# Patient Record
Sex: Male | Born: 1990 | Race: Asian | Hispanic: No | Marital: Single | Smoking: Never smoker
Health system: Southern US, Community
[De-identification: ages and names within clinical notes are randomized; demographics above are authoritative.]

## PROBLEM LIST (undated history)

## (undated) ENCOUNTER — Emergency Department: Admission: EM | Payer: Federal, State, Local not specified - PPO | Source: Home / Self Care

---

## 2010-11-11 ENCOUNTER — Emergency Department (HOSPITAL_COMMUNITY)
Admission: EM | Admit: 2010-11-11 | Discharge: 2010-11-11 | Disposition: A | Discharge status: Home / Self Care | Payer: Federal, State, Local not specified - PPO | Attending: Emergency Medicine | Admitting: Emergency Medicine

## 2010-11-11 DIAGNOSIS — L255 Unspecified contact dermatitis due to plants, except food: Secondary | ICD-10-CM | POA: Insufficient documentation

## 2010-11-11 DIAGNOSIS — T622X1A Toxic effect of other ingested (parts of) plant(s), accidental (unintentional), initial encounter: Secondary | ICD-10-CM | POA: Insufficient documentation

## 2011-11-04 ENCOUNTER — Ambulatory Visit (INDEPENDENT_AMBULATORY_CARE_PROVIDER_SITE_OTHER): Payer: Federal, State, Local not specified - PPO | Admitting: Family Medicine

## 2011-11-04 VITALS — BP 102/65 | HR 67 | Temp 97.6°F | Resp 16 | Ht 67.5 in | Wt 104.0 lb

## 2011-11-04 DIAGNOSIS — L255 Unspecified contact dermatitis due to plants, except food: Secondary | ICD-10-CM

## 2011-11-04 DIAGNOSIS — L237 Allergic contact dermatitis due to plants, except food: Secondary | ICD-10-CM

## 2011-11-04 MED ORDER — TRIAMCINOLONE ACETONIDE 0.1 % EX CREA: TOPICAL_CREAM | Freq: Two times a day (BID) | CUTANEOUS | Status: AC

## 2011-11-04 NOTE — Progress Notes (Signed)
  Subjective:    Patient ID: Timothy Moreno, male    DOB: [birth year], 21 y.o.   MRN: [id number]  HPI 21 yo male with concern for poison ivy rash.  Working in garden yesterday.  Wearing short sleeves and short.  Woke up this morning with very itchy red rash on forearms and lower legs.  Had poison ivy several months ago and let it go for awhile and it got very bad.    Review of Systems    Negative except as per HPI  Objective:   Physical Exam  Constitutional: He appears well-developed.  Pulmonary/Chest: Effort normal.  Neurological: He is alert.  Skin:       Erythematous macular rash in linear groupings, occasional vesicle, on forearms and lower legs bilaterally.           Assessment & Plan:  Poison ivy dermatitis - relatively small, localized patches.  Try to treat topically with TAC.  If worsens or not starting to imrpove in 3-4 days, call and we can call out a prednisone taper.

## 2011-12-20 ENCOUNTER — Ambulatory Visit (INDEPENDENT_AMBULATORY_CARE_PROVIDER_SITE_OTHER): Payer: Federal, State, Local not specified - PPO | Admitting: Family Medicine

## 2011-12-20 VITALS — BP 96/61 | HR 63 | Temp 98.3°F | Resp 16 | Ht 66.5 in | Wt 104.0 lb

## 2011-12-20 DIAGNOSIS — K120 Recurrent oral aphthae: Secondary | ICD-10-CM

## 2011-12-20 NOTE — Progress Notes (Signed)
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

## 2011-12-23 ENCOUNTER — Telehealth: Payer: Self-pay

## 2011-12-23 NOTE — Telephone Encounter (Signed)
Yes, ok to refill 

## 2011-12-23 NOTE — Telephone Encounter (Signed)
Ok to refill 

## 2011-12-23 NOTE — Telephone Encounter (Signed)
PT SPILLED CHLORHEXIDINE ORAL RINSE RX AND WOULD LIKE ANOTHER RX CALLED IN FOR HIM   PLEASE [facility]   [facility] [address]  PT PHONE [phone]

## 2011-12-23 NOTE — Telephone Encounter (Signed)
Called [facility] and spoke w/pharmacist and asked him to RF the chlorhexidine rinse for pt as previously Rxd. Notified pt.

## 2013-04-21 ENCOUNTER — Ambulatory Visit (INDEPENDENT_AMBULATORY_CARE_PROVIDER_SITE_OTHER): Payer: Federal, State, Local not specified - PPO | Admitting: Family Medicine

## 2013-04-21 DIAGNOSIS — Z23 Encounter for immunization: Secondary | ICD-10-CM

## 2014-03-09 ENCOUNTER — Ambulatory Visit (INDEPENDENT_AMBULATORY_CARE_PROVIDER_SITE_OTHER): Payer: Federal, State, Local not specified - PPO | Admitting: Radiology

## 2014-03-09 DIAGNOSIS — Z23 Encounter for immunization: Secondary | ICD-10-CM

## 2014-09-22 ENCOUNTER — Ambulatory Visit (INDEPENDENT_AMBULATORY_CARE_PROVIDER_SITE_OTHER): Payer: Federal, State, Local not specified - PPO

## 2014-09-22 ENCOUNTER — Ambulatory Visit (INDEPENDENT_AMBULATORY_CARE_PROVIDER_SITE_OTHER): Payer: Federal, State, Local not specified - PPO | Admitting: Emergency Medicine

## 2014-09-22 VITALS — BP 100/66 | HR 75 | Temp 97.8°F | Resp 12 | Ht 67.5 in | Wt 108.0 lb

## 2014-09-22 DIAGNOSIS — R1030 Lower abdominal pain, unspecified: Secondary | ICD-10-CM

## 2014-09-22 DIAGNOSIS — K5900 Constipation, unspecified: Secondary | ICD-10-CM

## 2014-09-22 LAB — POCT URINALYSIS DIPSTICK
Bilirubin, UA: NEGATIVE
GLUCOSE UA: NEGATIVE
KETONES UA: NEGATIVE
LEUKOCYTES UA: NEGATIVE
NITRITE UA: NEGATIVE
Protein, UA: NEGATIVE
RBC UA: NEGATIVE
SPEC GRAV UA: 1.015
Urobilinogen, UA: 0.2
pH, UA: 6

## 2014-09-22 LAB — POCT CBC
GRANULOCYTE PERCENT: 68 % (ref 37–80)
HCT, POC: 46.8 % (ref 43.5–53.7)
Hemoglobin: 16.4 g/dL (ref 14.1–18.1)
LYMPH, POC: 2 (ref 0.6–3.4)
MCH: 29.6 pg (ref 27–31.2)
MCHC: 35 g/dL (ref 31.8–35.4)
MCV: 84.4 fL (ref 80–97)
MID (CBC): 0.7 (ref 0–0.9)
MPV: 7.9 fL (ref 0–99.8)
POC Granulocyte: 5.6 (ref 2–6.9)
POC LYMPH PERCENT: 23.7 %L (ref 10–50)
POC MID %: 8.3 %M (ref 0–12)
Platelet Count, POC: 250 10*3/uL (ref 142–424)
RBC: 5.54 M/uL (ref 4.69–6.13)
RDW, POC: 13.3 %
WBC: 8.3 10*3/uL (ref 4.6–10.2)

## 2014-09-22 LAB — POCT UA - MICROSCOPIC ONLY
Bacteria, U Microscopic: NEGATIVE
CASTS, UR, LPF, POC: NEGATIVE
CRYSTALS, UR, HPF, POC: NEGATIVE
Epithelial cells, urine per micros: NEGATIVE
Mucus, UA: NEGATIVE
RBC, urine, microscopic: NEGATIVE
WBC, Ur, HPF, POC: NEGATIVE
Yeast, UA: NEGATIVE

## 2014-09-22 MED ORDER — BISACODYL 5 MG PO TBEC: 5.0000 mg | DELAYED_RELEASE_TABLET | Freq: Every day | ORAL | Status: AC | PRN

## 2014-09-22 MED ORDER — POLYETHYLENE GLYCOL 3350 17 GM/SCOOP PO POWD: 17.0000 g | Freq: Two times a day (BID) | ORAL | Status: AC | PRN

## 2014-09-22 NOTE — Progress Notes (Signed)
[facility] [address], [place] [address] [phone]  Date:  09/22/2014   Name:  MASLOWien Eckert   DOB:  01-13-1991   MRN:  [id number]  PCP:  No PCP Per Patient    Chief Complaint: Abdominal Pain; Abdominal Cramping; and Constipation   History of Present Illness:  MOULTRIEien Seese is a 24 y.o. very pleasant male patient who presents with the following:  Patient states he is constipated. Last BM 0400 yesterday and was hard and scant No blood.   Took pepto yesterday without result. Had difficulty sleeping last night due abdominal cramping. Denies hypothyroid, change in diet or hydration.  No history of GI disease No nausea or vomiting. No improvement with over the counter medications or other home remedies.  Denies other complaint or health concern today.   There are no active problems to display for this patient.   No past medical history on file.  No past surgical history on file.  History  Substance Use Topics  . Smoking status: Never Smoker   . Smokeless tobacco: Never Used  . Alcohol Use: Not on file    No family history on file.  No Known Allergies  Medication list has been reviewed and updated.  Current Outpatient Prescriptions on File Prior to Visit  Medication Sig Dispense Refill  . Ibuprofen (ADVIL PO) Take 800 mg by mouth 2 (two) times daily.     No current facility-administered medications on file prior to visit.    Review of Systems:  As per HPI, otherwise negative.    Physical Examination: Filed Vitals:   09/22/14 0827  BP: 100/66  Pulse: 75  Temp: 97.8 F (36.6 C)  Resp: 12   Filed Vitals:   09/22/14 0827  Height: 5' 7.5" (1.715 m)  Weight: 108 lb (48.988 kg)   Body mass index is 16.66 kg/(m^2). Ideal Body Weight: Weight in (lb) to have BMI = 25: 161.7  GEN: WDWN, NAD, Non-toxic, A & O x 3 HEENT: Atraumatic, Normocephalic. Neck supple. No masses, No LAD. Ears and Nose: No external deformity. CV: RRR, No M/G/R. No  JVD. No thrill. No extra heart sounds. PULM: CTA B, no wheezes, crackles, rhonchi. No retractions. No resp. distress. No accessory muscle use. ABD: S, NT, ND, +BS. No rebound. No HSM. EXTR: No c/c/e NEURO Normal gait.  PSYCH: Normally interactive. Conversant. Not depressed or anxious appearing.  Calm demeanor.    Assessment and Plan: Constipation Abdominal pain  Signed,  Phillips OdorJeffery Elijah Phommachanh, MD   [facility] reading (PRIMARY) by  Dr. Dareen PianoAnderson  Constipation  OTW negative.     Results for orders placed or performed in visit on 09/22/14  POCT CBC  Result Value Ref Range   WBC 8.3 4.6 - 10.2 K/uL   Lymph, poc 2.0 0.6 - 3.4   POC LYMPH PERCENT 23.7 10 - 50 %L   MID (cbc) 0.7 0 - 0.9   POC MID % 8.3 0 - 12 %M   POC Granulocyte 5.6 2 - 6.9   Granulocyte percent 68.0 37 - 80 %G   RBC 5.54 4.69 - 6.13 M/uL   Hemoglobin 16.4 14.1 - 18.1 g/dL   HCT, POC 95.246.8 84.143.5 - 53.7 %   MCV 84.4 80 - 97 fL   MCH, POC 29.6 27 - 31.2 pg   MCHC 35.0 31.8 - 35.4 g/dL   RDW, POC 32.413.3 %   Platelet Count, POC 250 142 - 424 K/uL   MPV 7.9 0 -  99.8 fL  POCT urinalysis dipstick  Result Value Ref Range   Color, UA yellow    Clarity, UA clear    Glucose, UA neg    Bilirubin, UA neg    Ketones, UA neg    Spec Grav, UA 1.015    Blood, UA neg    pH, UA 6.0    Protein, UA neg    Urobilinogen, UA 0.2    Nitrite, UA neg    Leukocytes, UA Negative   POCT UA - Microscopic Only  Result Value Ref Range   WBC, Ur, HPF, POC neg    RBC, urine, microscopic neg    Bacteria, U Microscopic neg    Mucus, UA neg    Epithelial cells, urine per micros neg    Crystals, Ur, HPF, POC neg    Casts, Ur, LPF, POC neg    Yeast, UA neg

## 2014-09-22 NOTE — Patient Instructions (Signed)

## 2014-10-02 ENCOUNTER — Ambulatory Visit (INDEPENDENT_AMBULATORY_CARE_PROVIDER_SITE_OTHER): Payer: Federal, State, Local not specified - PPO | Admitting: Emergency Medicine

## 2014-10-02 ENCOUNTER — Ambulatory Visit
Admission: RE | Admit: 2014-10-02 | Discharge: 2014-10-02 | Disposition: A | Discharge status: Home / Self Care | Payer: Federal, State, Local not specified - PPO | Source: Ambulatory Visit | Attending: Emergency Medicine | Admitting: Emergency Medicine

## 2014-10-02 ENCOUNTER — Telehealth: Payer: Self-pay

## 2014-10-02 VITALS — BP 116/70 | HR 72 | Temp 97.7°F | Resp 18 | Ht 67.0 in | Wt 107.0 lb

## 2014-10-02 DIAGNOSIS — H538 Other visual disturbances: Secondary | ICD-10-CM | POA: Diagnosis not present

## 2014-10-02 DIAGNOSIS — K5900 Constipation, unspecified: Secondary | ICD-10-CM | POA: Diagnosis not present

## 2014-10-02 DIAGNOSIS — R51 Headache: Secondary | ICD-10-CM

## 2014-10-02 DIAGNOSIS — R519 Headache, unspecified: Secondary | ICD-10-CM

## 2014-10-02 NOTE — Progress Notes (Signed)
[facility] [address], [place] [address] [phone]  Date:  10/02/2014   Name:  KNISKERNien Moreno   DOB:  1991/02/14   MRN:  [id number]  PCP:  No PCP Per Patient    Chief Complaint: Headache   History of Present Illness:  MAUEien Moreno is a 24 y.o. very pleasant male patient who presents with the following:  Patient was well on retiring last night.  When he awakened, he noted a rather severe constant headache in the right occipital region Associated with transient left visual blurring No antecedent illness or injury.  No history of prior severe headaches. No neuro or other visual symptoms.  No cough or coryza No rash No improvement with over the counter medications or other home remedies.  Denies other complaint or health concern today.   Patient Active Problem List   Diagnosis Date Noted  . Constipation 10/02/2014    History reviewed. No pertinent past medical history.  History reviewed. No pertinent past surgical history.  History  Substance Use Topics  . Smoking status: Never Smoker   . Smokeless tobacco: Never Used  . Alcohol Use: Not on file    Family History  Problem Relation Age of Onset  . Hypertension Mother   . Diabetes Mother   . Hyperlipidemia Mother   . Hypertension Father     No Known Allergies  Medication list has been reviewed and updated.  Current Outpatient Prescriptions on File Prior to Visit  Medication Sig Dispense Refill  . Ibuprofen (ADVIL PO) Take 800 mg by mouth 2 (two) times daily.    . bisacodyl (DULCOLAX) 5 MG EC tablet Take 1 tablet (5 mg total) by mouth daily as needed for moderate constipation. (Patient not taking: Reported on 10/02/2014) 30 tablet 0  . polyethylene glycol powder (GLYCOLAX/MIRALAX) powder Take 17 g by mouth 2 (two) times daily as needed. (Patient not taking: Reported on 10/02/2014) 3350 g 1   No current facility-administered medications on file prior to visit.    Review of Systems:  As per HPI,  otherwise negative.    Physical Examination: Filed Vitals:   10/02/14 1304  BP: 116/70  Pulse: 72  Temp: 97.7 F (36.5 C)  Resp: 18   Filed Vitals:   10/02/14 1304  Height: 5\' 7"  (1.702 m)  Weight: 107 lb (48.535 kg)   Body mass index is 16.75 kg/(m^2). Ideal Body Weight: Weight in (lb) to have BMI = 25: 159.3  GEN: WDWN, NAD, Non-toxic, A & O x 3 HEENT: Atraumatic, Normocephalic. Neck supple. No masses, No LAD.  PRRERLA EOMI CN 2-12 intact Ears and Nose: No external deformity. CV: RRR, No M/G/R. No JVD. No thrill. No extra heart sounds. PULM: CTA B, no wheezes, crackles, rhonchi. No retractions. No resp. distress. No accessory muscle use. ABD: S, NT, ND, +BS. No rebound. No HSM. EXTR: No c/c/e NEURO Normal gait. Romberg and tandem gait intact PSYCH: Normally interactive. Conversant. Not depressed or anxious appearing.  Calm demeanor.    Assessment and Plan: Worst headache of life ct  Signed,  Phillips OdorJeffery Kao Berkheimer, MD

## 2014-10-02 NOTE — Patient Instructions (Signed)

## 2014-11-15 ENCOUNTER — Other Ambulatory Visit: Payer: Self-pay | Admitting: Family Medicine

## 2015-06-16 IMAGING — CR DG ABDOMEN ACUTE W/ 1V CHEST
3 series · 3 of 3 positions shown · non-contrast
Comparison: None.

CLINICAL DATA: Lower abdominal pain and constipation.

EXAM:
ACUTE ABDOMEN SERIES (ABDOMEN 2 VIEW & CHEST 1 VIEW)

[PA]
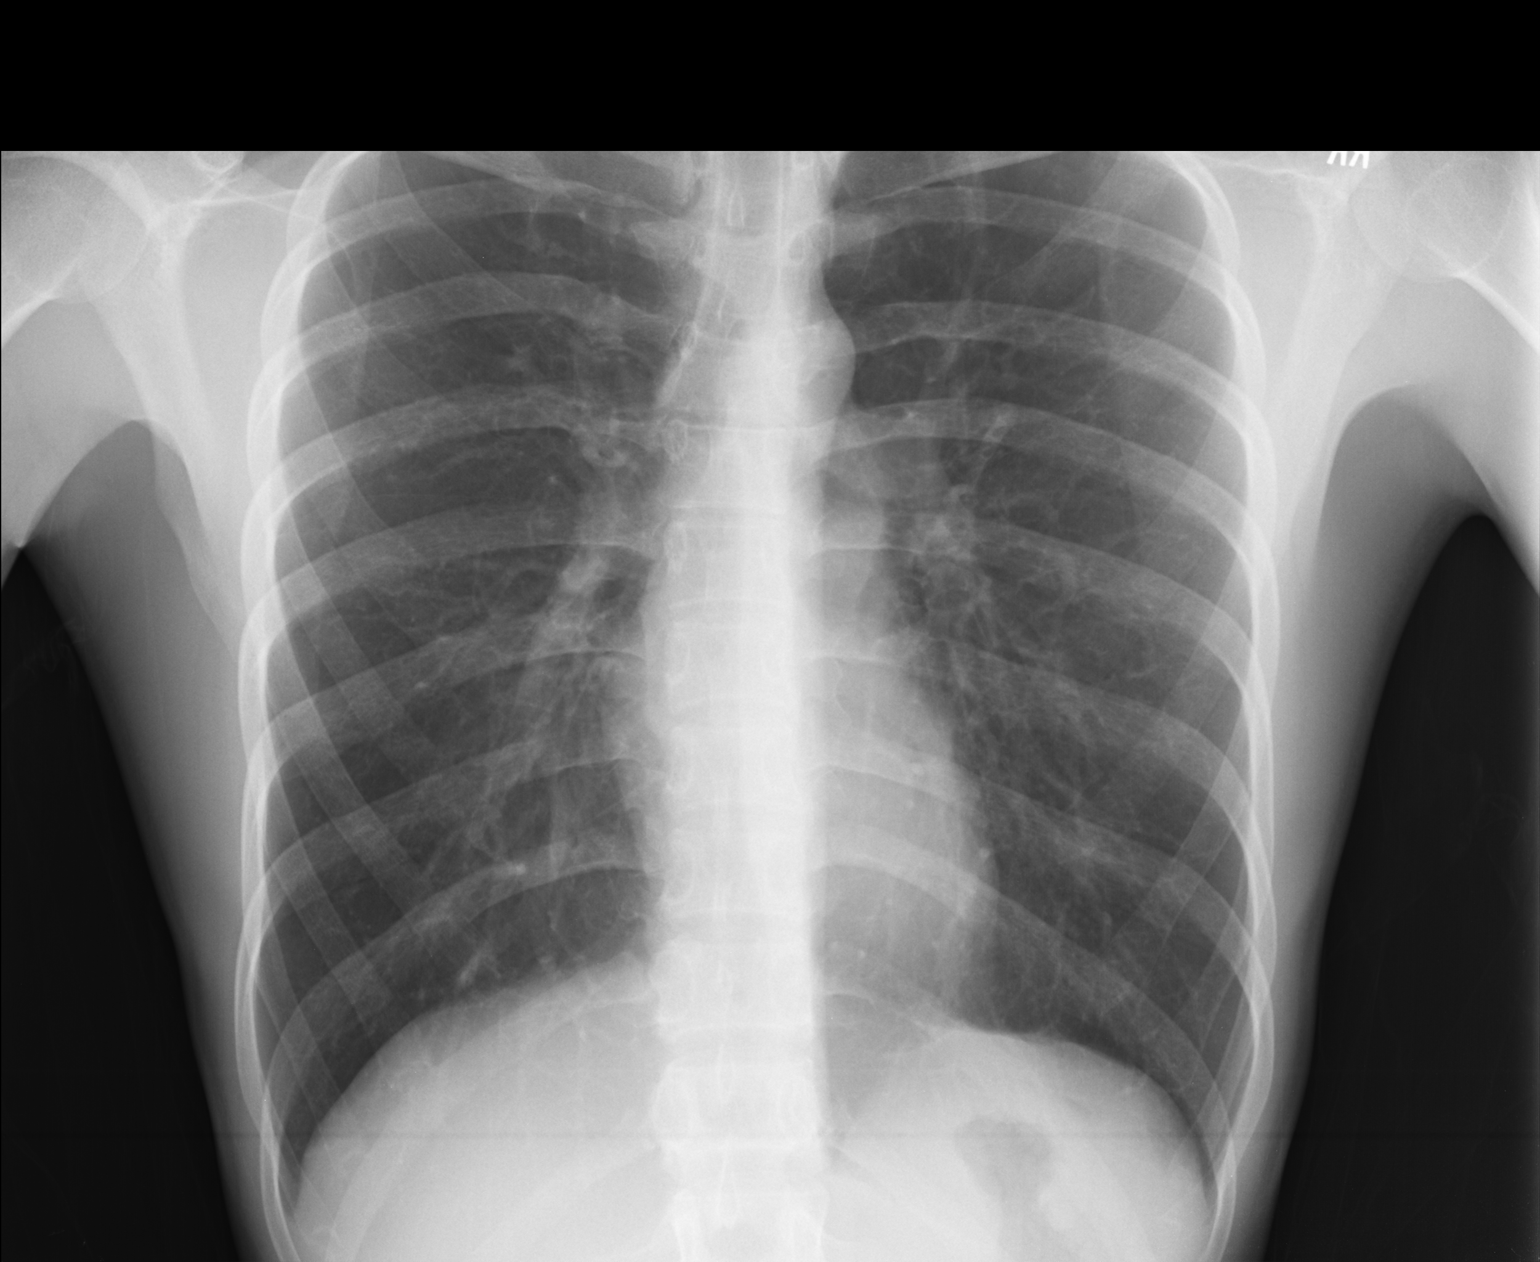

[AP (1 of 2)]
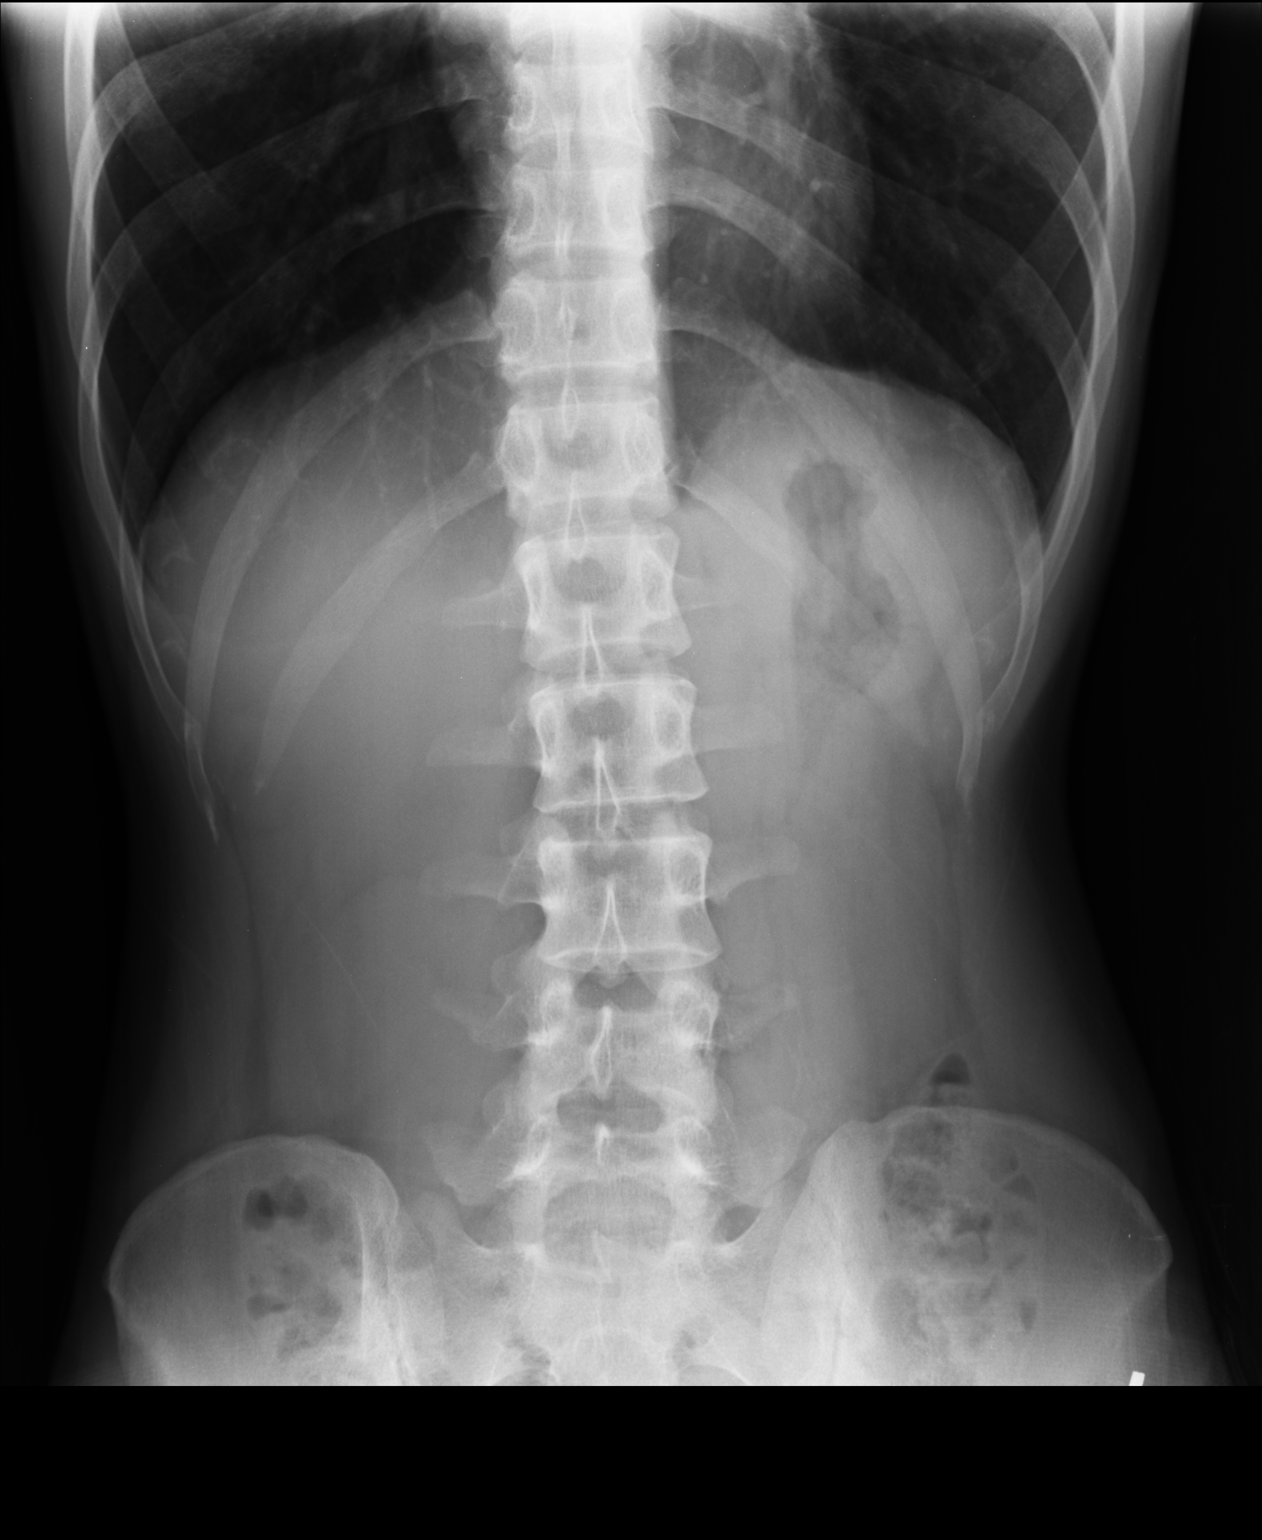

[AP (2 of 2)]
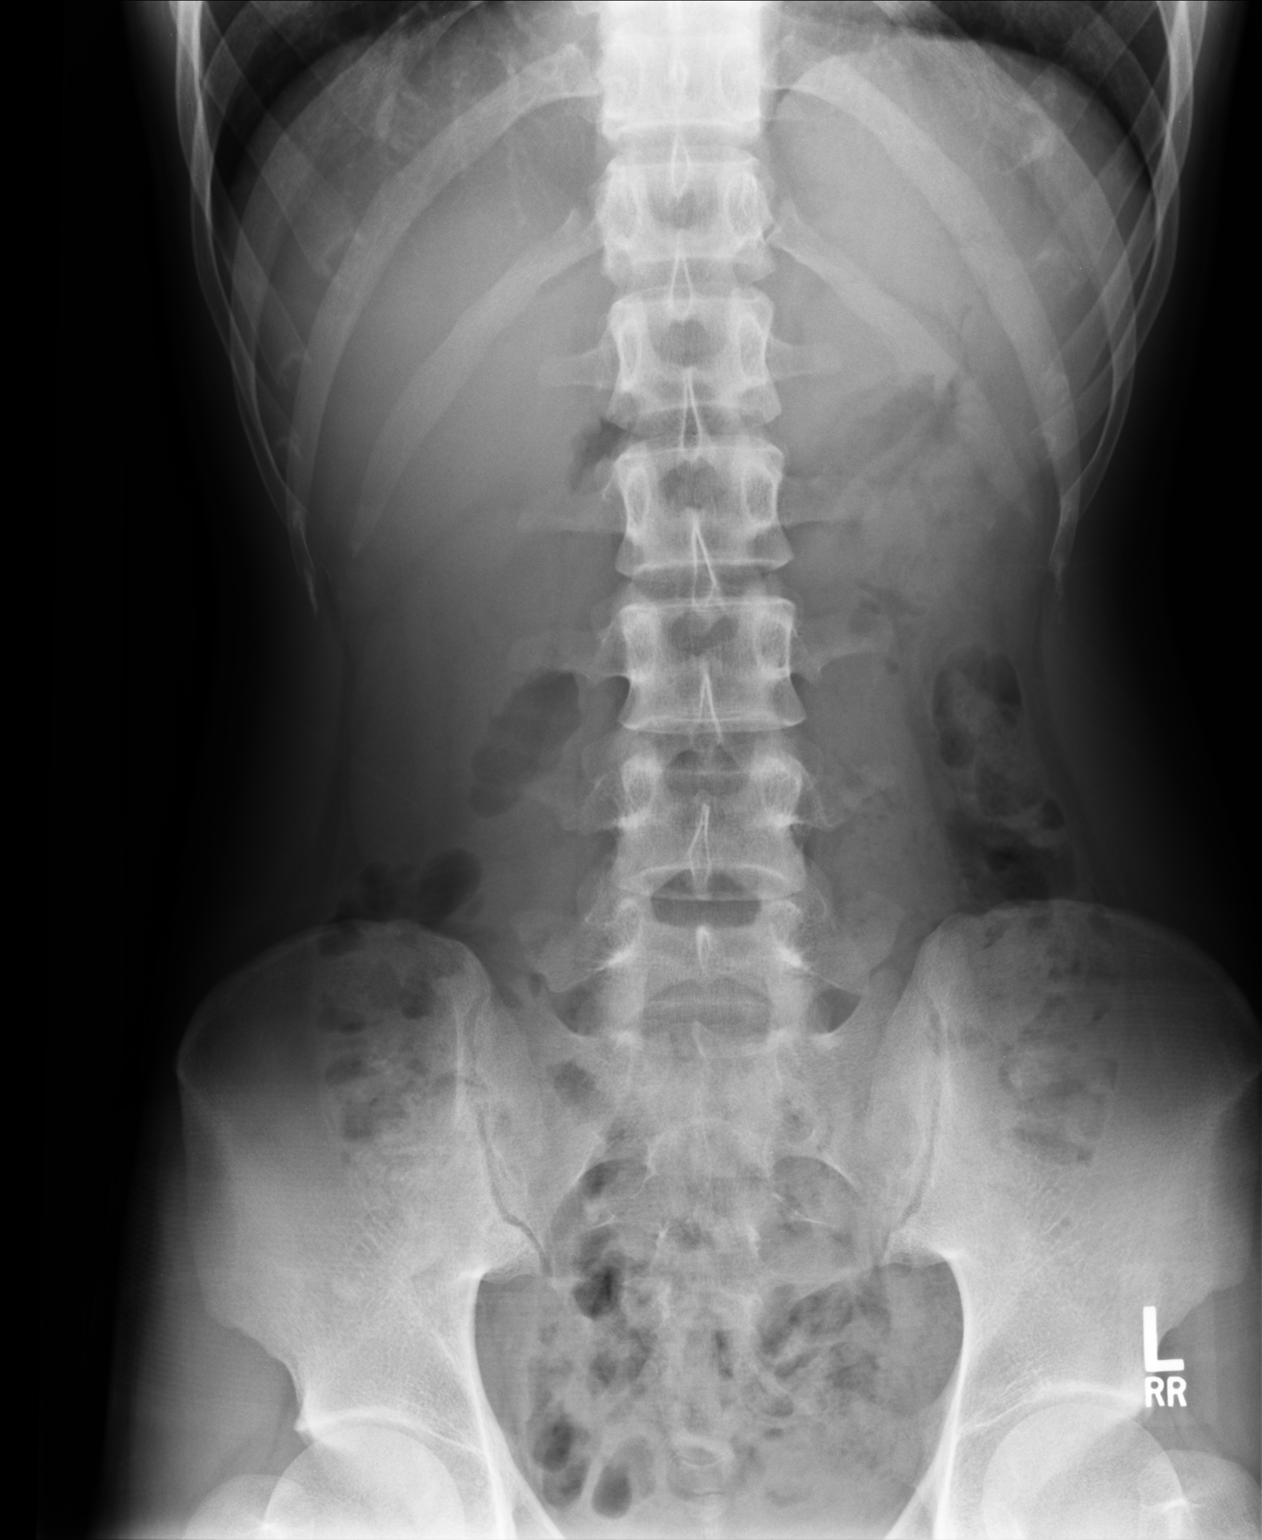

[3 of 3 positions shown; findings below may reference images not displayed]

FINDINGS: There is no evidence of dilated bowel loops or free intraperitoneal
air. Moderate amount of stool noted. No radiopaque calculi or other
significant radiographic abnormality is seen.

Heart size and mediastinal contours are within normal limits. Both
lungs are clear.
IMPRESSION: No acute findings.  Moderate stool burden noted.

## 2015-06-26 IMAGING — CT CT HEAD W/O CM
2 series · 16 of 30 positions shown, 20 images · non-contrast
Comparison: None.

CLINICAL DATA: Worst headache ever in the right parietal region,
blurred vision

EXAM:
CT HEAD WITHOUT CONTRAST
TECHNIQUE: Contiguous axial images were obtained from the base of the skull
through the vertex without intravenous contrast.

[Series 3: head bone · axial · 0.49mm/px · z∈[+18,+63]mm · 3 of 32 slices shown]
[im 3/32  bone]
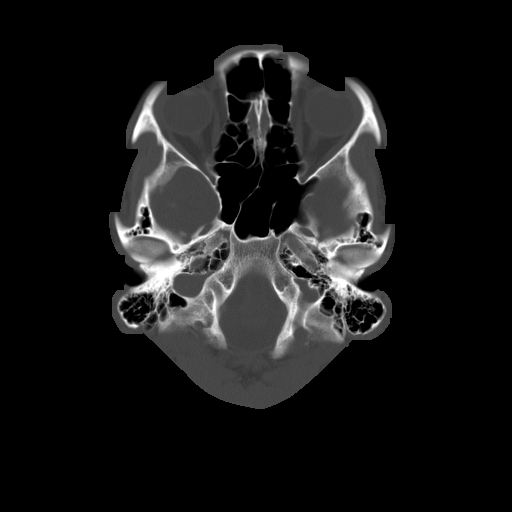
[im 7/32  bone]
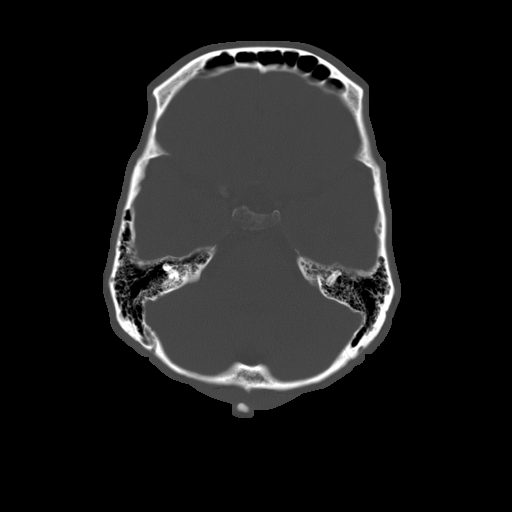
[im 12/32  bone]
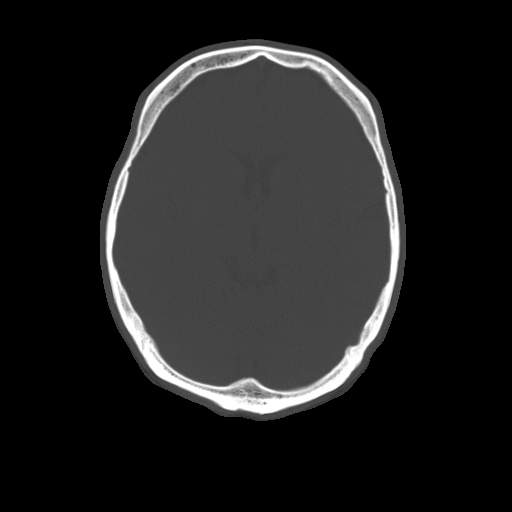

[Series 32: 3d filtered head w/o · axial · non-contrast · 0.49mm/px · z∈[+18,+148]mm · 13 of 32 slices shown, 17 images]
[im 3/32  brain]
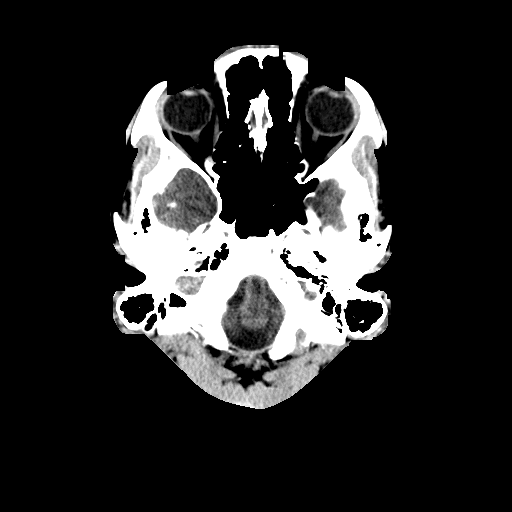
[im 3/32  bone]
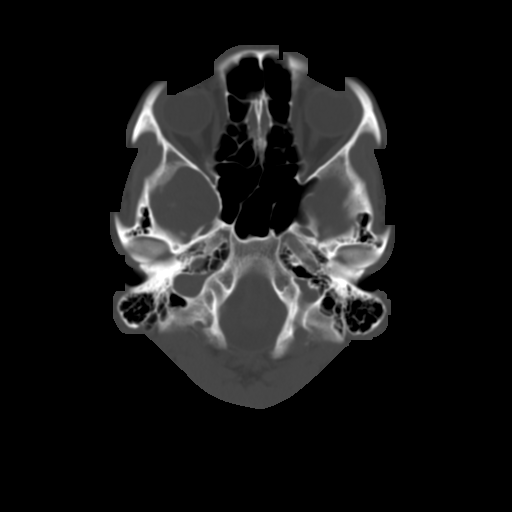
[im 5/32  brain]
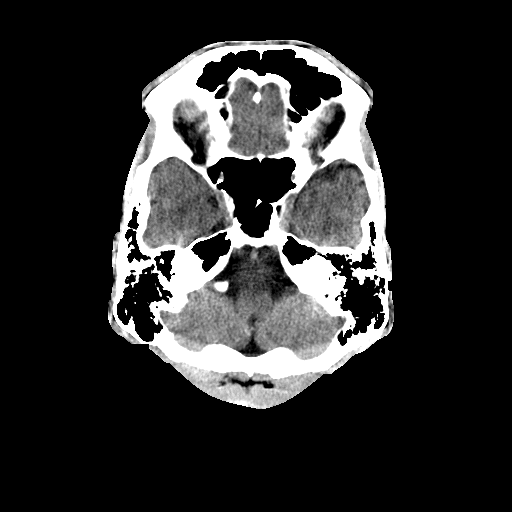
[im 7/32  brain]
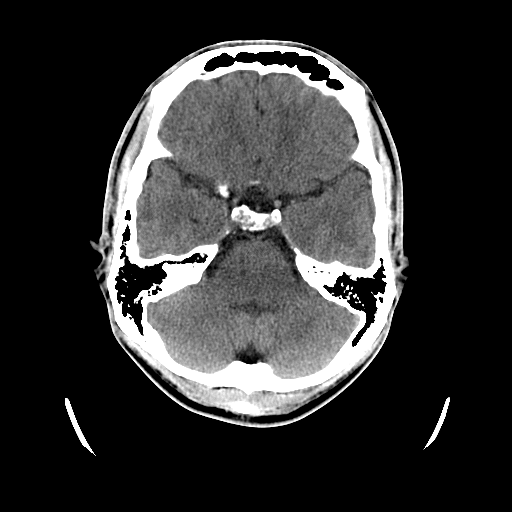
[im 9/32  brain]
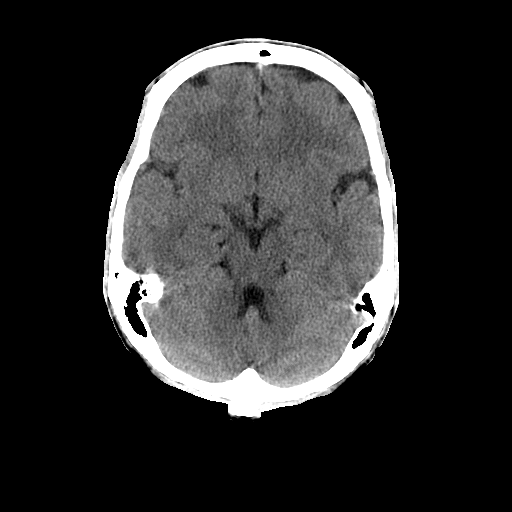
[im 12/32  brain]
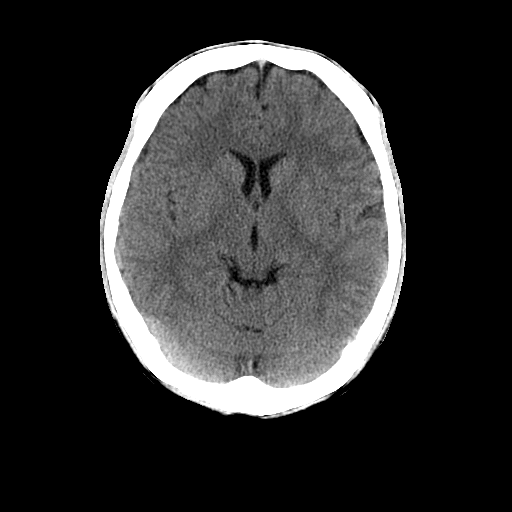
[im 12/32  bone]
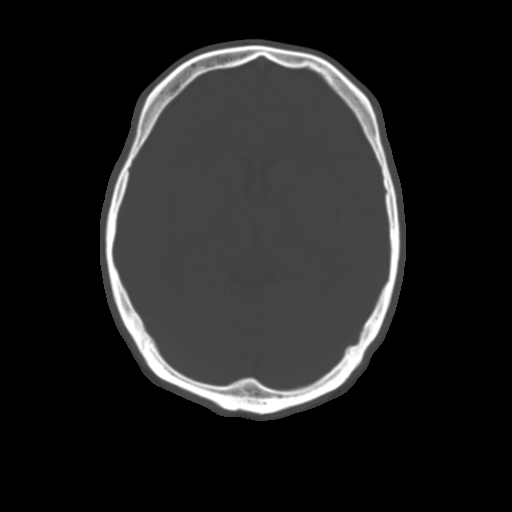
[im 14/32  brain]
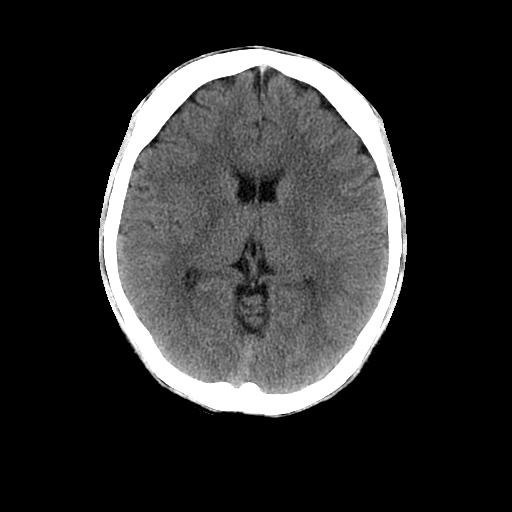
[im 16/32  brain]
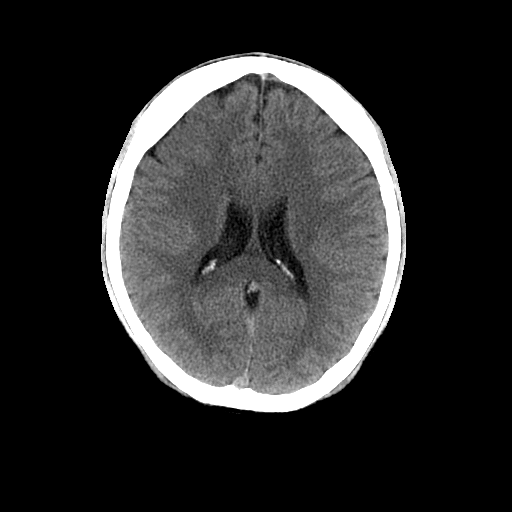
[im 18/32  brain]
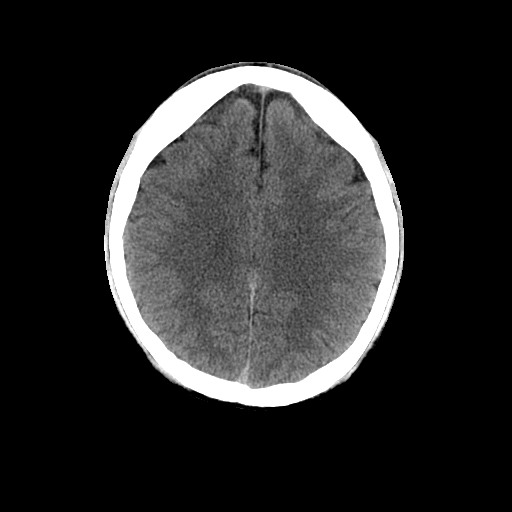
[im 20/32  brain]
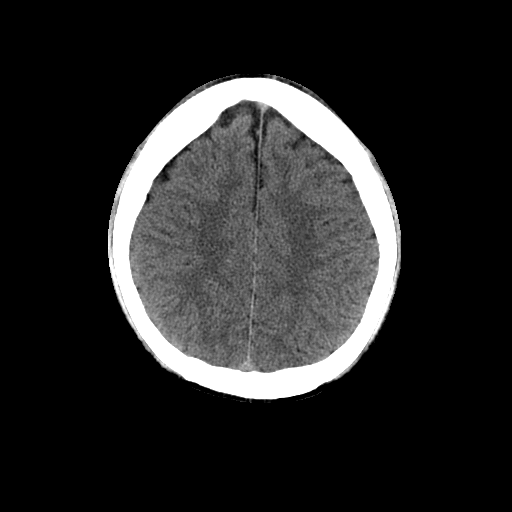
[im 20/32  bone]
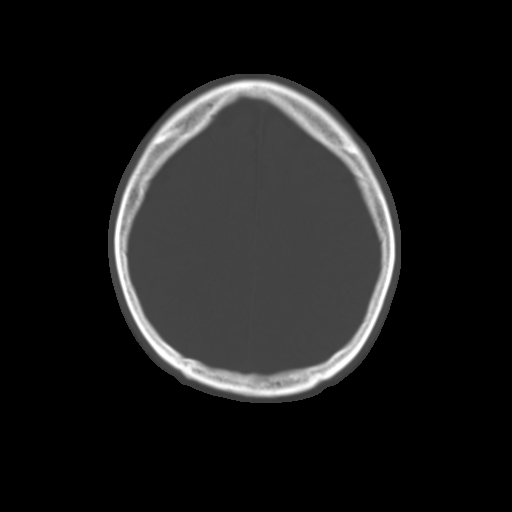
[im 23/32  brain]
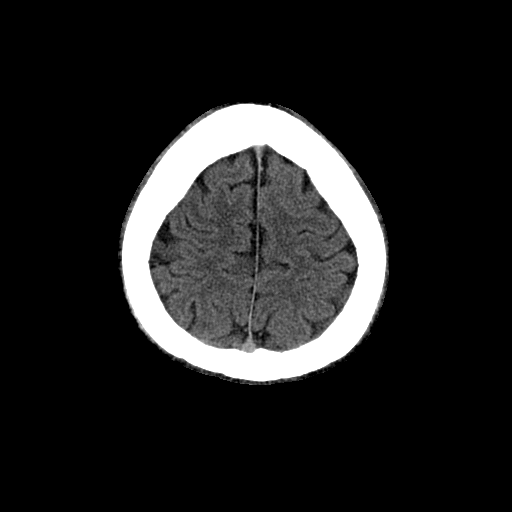
[im 25/32  brain]
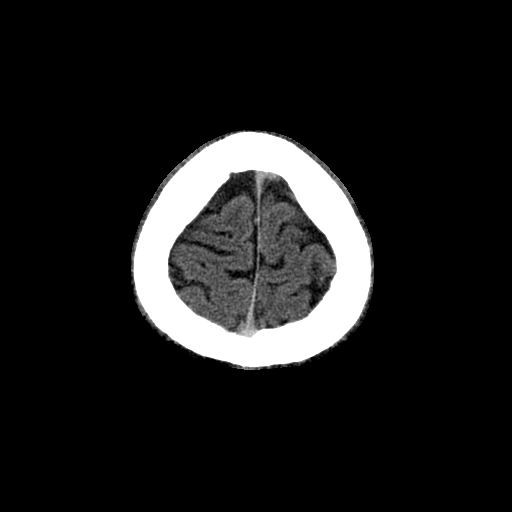
[im 27/32  brain]
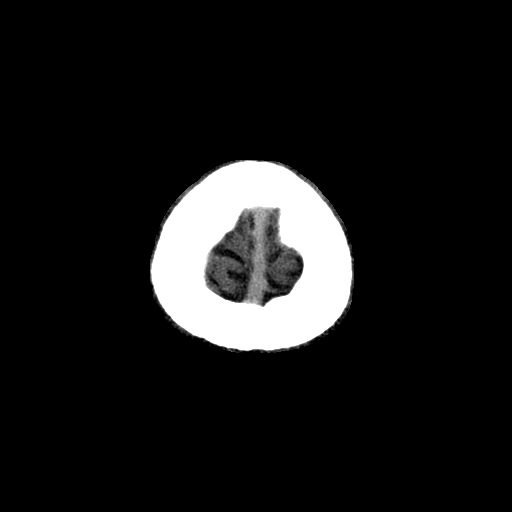
[im 29/32  brain]
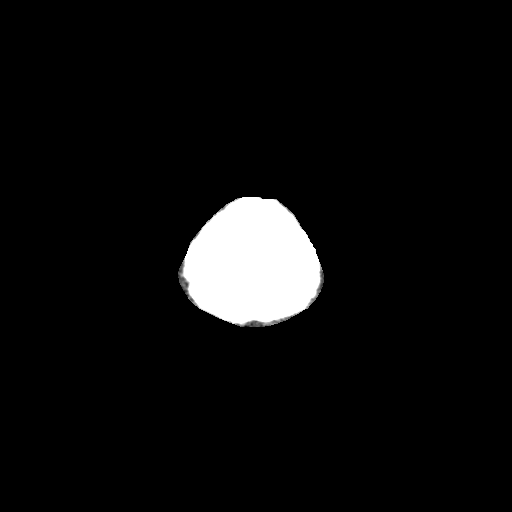
[im 29/32  bone]
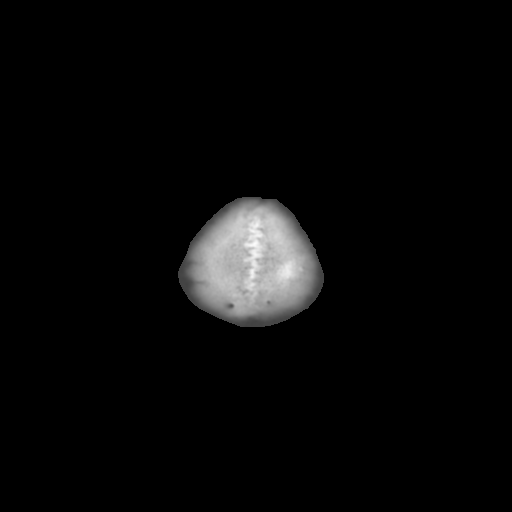

[16 of 30 positions shown; findings below may reference images not displayed]

FINDINGS: The ventricular system is normal in size and configuration, and the
septum is midline in position. The fourth ventricle and basilar
cisterns are unremarkable. No hemorrhage, mass lesion, or acute
infarction is seen. On bone window images, no calvarial abnormality
is noted. Mucosal thickening in the posterior right maxillary sinus
cannot be excluded.
IMPRESSION: 1. Negative unenhanced CT of the brain.
2. Question mucosal thickening posteriorly within the right
maxillary sinus on image 1.

## 2015-10-31 NOTE — Telephone Encounter (Signed)
close

## 2019-12-17 ENCOUNTER — Ambulatory Visit: Payer: Self-pay

## 2019-12-20 ENCOUNTER — Ambulatory Visit: Payer: Self-pay | Attending: Internal Medicine

## 2019-12-20 DIAGNOSIS — Z23 Encounter for immunization: Secondary | ICD-10-CM

## 2019-12-20 NOTE — Progress Notes (Signed)
° °  Covid-19 Vaccination Clinic  Name:  Siddhanth Denk    MRN: [id number] DOB: 1990-10-23  12/20/2019  Mr. Bailey was observed post Covid-19 immunization for 15 minutes without incident. He was provided with Vaccine Information Sheet and instruction to access the V-Safe system.   Mr. Parfait was instructed to call 911 with any severe reactions post vaccine:  Difficulty breathing   Swelling of face and throat   A fast heartbeat   A bad rash all over body   Dizziness and weakness   Immunizations Administered    Name Date Dose VIS Date Route   Pfizer COVID-19 Vaccine 12/20/2019 11:54 AM 0.3 mL 08/15/2018 Intramuscular   Manufacturer: [employer], [employer]   Lot: [id number]   NDC: [address]-6

## 2020-01-07 ENCOUNTER — Ambulatory Visit: Payer: Self-pay

## 2020-01-10 ENCOUNTER — Ambulatory Visit: Payer: Self-pay | Attending: Internal Medicine

## 2020-01-10 DIAGNOSIS — Z23 Encounter for immunization: Secondary | ICD-10-CM

## 2020-01-10 NOTE — Progress Notes (Signed)
   Covid-19 Vaccination Clinic  Name:  Timothy Moreno    MRN: [id number] DOB: 10-Nov-1990  01/10/2020  Timothy Moreno was observed post Covid-19 immunization for 15 minutes without incident. He was provided with Vaccine Information Sheet and instruction to access the V-Safe system.   Timothy Moreno was instructed to call 911 with any severe reactions post vaccine: Marland Kitchen Difficulty breathing  . Swelling of face and throat  . A fast heartbeat  . A bad rash all over body  . Dizziness and weakness   Immunizations Administered    Name Date Dose VIS Date Route   Pfizer COVID-19 Vaccine 01/10/2020  9:07 AM 0.3 mL 08/15/2018 Intramuscular   Manufacturer: [employer], [employer]   Lot: [id number]   NDC: [address]-1

## 2020-06-06 ENCOUNTER — Other Ambulatory Visit: Payer: Self-pay

## 2020-06-07 LAB — HIV ANTIBODY (ROUTINE TESTING W REFLEX): HIV Screen 4th Generation wRfx: NONREACTIVE

## 2020-06-07 LAB — HCV INTERPRETATION

## 2020-06-07 LAB — HCV AB W REFLEX TO QUANT PCR: HCV Ab: <0.1 s/co ratio (ref 0.0–0.9)

## 2022-04-27 ENCOUNTER — Other Ambulatory Visit: Payer: Self-pay | Admitting: Obstetrics and Gynecology

## 2022-04-27 ENCOUNTER — Ambulatory Visit
Admission: RE | Admit: 2022-04-27 | Discharge: 2022-04-27 | Disposition: A | Discharge status: Home / Self Care | Payer: No Typology Code available for payment source | Source: Ambulatory Visit | Attending: Obstetrics and Gynecology | Admitting: Obstetrics and Gynecology

## 2022-04-27 DIAGNOSIS — R7611 Nonspecific reaction to tuberculin skin test without active tuberculosis: Secondary | ICD-10-CM
